# Patient Record
Sex: Female | Born: 2000 | Race: Black or African American | Hispanic: No | Marital: Single | Smoking: Never smoker
Health system: Southern US, Community
[De-identification: ages and names within clinical notes are randomized; demographics above are authoritative.]

## PROBLEM LIST (undated history)

## (undated) ENCOUNTER — Ambulatory Visit: Admission: EM | Payer: Managed Care, Other (non HMO)

## (undated) HISTORY — PX: OTHER SURGICAL HISTORY: SHX169

---

## 2020-11-06 ENCOUNTER — Encounter (HOSPITAL_COMMUNITY): Payer: Self-pay | Admitting: Emergency Medicine

## 2020-11-06 ENCOUNTER — Ambulatory Visit (HOSPITAL_COMMUNITY)
Admission: EM | Admit: 2020-11-06 | Discharge: 2020-11-06 | Disposition: A | Discharge status: Home / Self Care | Payer: Managed Care, Other (non HMO) | Attending: Emergency Medicine | Admitting: Emergency Medicine

## 2020-11-06 ENCOUNTER — Other Ambulatory Visit: Payer: Self-pay

## 2020-11-06 DIAGNOSIS — H1031 Unspecified acute conjunctivitis, right eye: Secondary | ICD-10-CM | POA: Diagnosis not present

## 2020-11-06 MED ORDER — ERYTHROMYCIN 5 MG/GM OP OINT: TOPICAL_OINTMENT | OPHTHALMIC | 0 refills | Status: DC

## 2020-11-06 NOTE — ED Triage Notes (Signed)
Pt states that a family member has pink eye and passed it to most of the family. Pt states that her sx stated Friday. Pt states that she also has a cough that started Wednesday. Pt states that she mostly coughs at night

## 2020-11-06 NOTE — Discharge Instructions (Addendum)
Use ointment as directed for 5 days.  Do not rub eyes.  Follow-up with your eye doctor in 2 to 3 days if no improvement, sooner if worse.

## 2020-11-07 ENCOUNTER — Encounter (HOSPITAL_COMMUNITY): Payer: Self-pay | Admitting: Emergency Medicine

## 2020-11-07 ENCOUNTER — Other Ambulatory Visit: Payer: Self-pay

## 2020-11-07 ENCOUNTER — Ambulatory Visit (HOSPITAL_COMMUNITY)
Admission: EM | Admit: 2020-11-07 | Discharge: 2020-11-07 | Disposition: A | Discharge status: Home / Self Care | Payer: Managed Care, Other (non HMO) | Attending: Emergency Medicine | Admitting: Emergency Medicine

## 2020-11-07 DIAGNOSIS — J020 Streptococcal pharyngitis: Secondary | ICD-10-CM

## 2020-11-07 DIAGNOSIS — J029 Acute pharyngitis, unspecified: Secondary | ICD-10-CM | POA: Diagnosis not present

## 2020-11-07 LAB — POCT RAPID STREP A, ED / UC: Streptococcus, Group A Screen (Direct): POSITIVE — AB

## 2020-11-07 MED ORDER — CEPHALEXIN 500 MG PO CAPS: 500.0000 mg | ORAL_CAPSULE | Freq: Two times a day (BID) | ORAL | 0 refills | Status: DC

## 2020-11-07 NOTE — ED Triage Notes (Signed)
Sore throat for a week.  Sore throat was not bad, until this morning.  Now throat feels to be on fire.  Patient has a cough  That worsens at night.  Denies fever.  Patient seen 11/06/2020 for conjunctivitis

## 2020-11-07 NOTE — Discharge Instructions (Signed)
You have strep throat.   Take the Keflex 1 pill twice a day for 10 days.   You can use Ibuprofen and/or Tylenol for fever reduction and pain relief.  You can use throat sprays, lozenges, warm salt water gargles for symptom relief.   Return or go to the Emergency Department if symptoms worsen or do not improve in the next few days. Marland Kitchen

## 2020-11-07 NOTE — ED Provider Notes (Signed)
[facility]   [id number] 11/07/20 Arrival Time: 1255  UL:AGTX THROAT  SUBJECTIVE: History from: patient.  Kimberly Cisneros is a 20 y.o. female who presents with abrupt onset of sore throat for 5 days.  Pain significantly worse this am.  Denies sick exposure to Covid, strep, flu or mono, or precipitating event. Symptoms are made worse with swallowing, but tolerating liquids and own secretions without difficulty.  Seen and treated for conjunctivitis yesterday with erythromycin eye ointment.   Denies fever, chills, fatigue, ear pain, sinus pain, rhinorrhea, nasal congestion, cough, SOB, wheezing, chest pain, nausea, rash, changes in bowel or bladder habits.     ROS: As per HPI.  All other pertinent ROS negative.     History reviewed. No pertinent past medical history. History reviewed. No pertinent surgical history. Allergies  Allergen Reactions  . Penicillins Swelling   No current facility-administered medications on file prior to encounter.   Current Outpatient Medications on File Prior to Encounter  Medication Sig Dispense Refill  . Pseudoeph-Doxylamine-DM-APAP (NYQUIL PO) Take by mouth.    . erythromycin ophthalmic ointment Place a 1/2 inch ribbon of ointment into the right  lower eyelid 4 times daily for 5 days 3.5 g 0   Social History   Socioeconomic History  . Marital status: Single    Spouse name: Not on file  . Number of children: Not on file  . Years of education: Not on file  . Highest education level: Not on file  Occupational History  . Not on file  Tobacco Use  . Smoking status: Never Smoker  . Smokeless tobacco: Never Used  Vaping Use  . Vaping Use: Never used  Substance and Sexual Activity  . Alcohol use: Yes    Comment: occass  . Drug use: Yes    Types: Marijuana  . Sexual activity: Not on file  Other Topics Concern  . Not on file  Social History Narrative  . Not on file   Social Determinants of Health   Financial Resource Strain: Not on  file  Food Insecurity: Not on file  Transportation Needs: Not on file  Physical Activity: Not on file  Stress: Not on file  Social Connections: Not on file  Intimate Partner Violence: Not on file   Family History  Problem Relation Age of Onset  . Breast cancer Mother   . Hypertension Father     OBJECTIVE:  Vitals:   11/07/20 1355  BP: 108/66  Pulse: 75  Resp: 18  Temp: 98.8 F (37.1 C)  TempSrc: Oral  SpO2: 100%     General appearance: alert; appears fatigued, but nontoxic, speaking in full sentences and managing own secretions HEENT: NCAT; Ears: EACs clear, TMs pearly gray with visible cone of light, without erythema; Eyes: PERRL, EOMI grossly; Nose: no obvious rhinorrhea; Throat: oropharynx clear, tonsils 1+ and mildly erythematous without white tonsillar exudates, uvula midline Neck: supple without LAD Lungs: CTA bilaterally without adventitious breath sounds; cough absent Heart: regular rate and rhythm.  Radial pulses 2+ symmetrical bilaterally Skin: warm and dry Psychological: alert and cooperative; normal mood and affect  LABS: Results for orders placed or performed during the hospital encounter of 11/07/20 (from the past 24 hour(s))  POCT Rapid Strep A     Status: Abnormal   Collection Time: 11/07/20  2:48 PM  Result Value Ref Range   Streptococcus, Group A Screen (Direct) POSITIVE (A) NEGATIVE     ASSESSMENT & PLAN:  1. Strep throat     Meds  ordered this encounter  Medications  . cephALEXin (KEFLEX) 500 MG capsule    Sig: Take 1 capsule (500 mg total) by mouth 2 (two) times daily for 10 days.    Dispense:  20 capsule    Refill:  0    Order Specific Question:   Supervising Provider    Answer:   Merrilee Jansky [id number]    Strep was positive.  Push fluids and get rest Prescribed keflex 500mg  BID for 10 days.  Take as directed and to completion.  Drink warm or cool liquids, use throat lozenges, or popsicles to help alleviate symptoms Take OTC  ibuprofen or tylenol as needed for pain Follow up with PCP if symptoms persist Return or go to ER if you have any new or worsening symptoms such as fever, chills, nausea, vomiting, worsening sore throat, cough, abdominal pain, chest pain, changes in bowel or bladder habits  Reviewed expectations re: course of current medical issues. Questions answered. Outlined signs and symptoms indicating need for more acute intervention. Patient verbalized understanding. After Visit Summary given.          , NP 11/07/20 1515

## 2020-11-16 ENCOUNTER — Telehealth (HOSPITAL_COMMUNITY): Payer: Self-pay | Admitting: Emergency Medicine

## 2020-11-16 DIAGNOSIS — J020 Streptococcal pharyngitis: Secondary | ICD-10-CM

## 2020-11-16 MED ORDER — CEPHALEXIN 500 MG PO CAPS: 500.0000 mg | ORAL_CAPSULE | Freq: Two times a day (BID) | ORAL | 0 refills | Status: AC

## 2020-11-16 NOTE — Telephone Encounter (Signed)
Informed by staff that patient has lost her Keflex prescription.  Will E prescribe a another prescription of Keflex 500 mg twice daily for 10 days for strep pharyngitis to pharmacy on record.

## 2021-05-15 ENCOUNTER — Other Ambulatory Visit: Payer: Self-pay

## 2021-05-15 ENCOUNTER — Ambulatory Visit (HOSPITAL_COMMUNITY)
Admission: EM | Admit: 2021-05-15 | Discharge: 2021-05-15 | Disposition: A | Discharge status: Home / Self Care | Payer: Managed Care, Other (non HMO) | Attending: Emergency Medicine | Admitting: Emergency Medicine

## 2021-05-15 ENCOUNTER — Encounter (HOSPITAL_COMMUNITY): Payer: Self-pay | Admitting: Emergency Medicine

## 2021-05-15 DIAGNOSIS — N898 Other specified noninflammatory disorders of vagina: Secondary | ICD-10-CM

## 2021-05-15 MED ORDER — FLUCONAZOLE 150 MG PO TABS: 150.0000 mg | ORAL_TABLET | Freq: Every day | ORAL | 0 refills | Status: AC

## 2021-05-15 NOTE — ED Provider Notes (Signed)
[facility]    CSN: [id number] Arrival date & time: 05/15/21  1118      History   Chief Complaint Chief Complaint  Patient presents with   Vaginal Discharge    HPI Kimberly Cisneros is a 20 y.o. female.   Patient presents with thick Kimberly Cisneros discharge for 4 days with associated odor.  Sexually active, 1 partner, condom use.  Endorses that this is occurred occurred before when using condoms.  Denies itching, irritation, dysuria, hematuria, abdominal pain or pressure, flank pain, fever, chills.  History reviewed. No pertinent past medical history.  Patient Active Problem List   Diagnosis Date Noted   Acute bacterial conjunctivitis of right eye 11/06/2020    History reviewed. No pertinent surgical history.  OB History   No obstetric history on file.      Home Medications    Prior to Admission medications   Medication Sig Start Date End Date Taking? Authorizing Provider  fluconazole (DIFLUCAN) 150 MG tablet Take 1 tablet (150 mg total) by mouth daily for 2 doses. 05/15/21 05/17/21 Yes Sire Poet, Elita Boone, NP  erythromycin ophthalmic ointment Place a 1/2 inch ribbon of ointment into the right  lower eyelid 4 times daily for 5 days 11/06/20   Defelice, Para March, NP  Pseudoeph-Doxylamine-DM-APAP (NYQUIL PO) Take by mouth.    [provider]    Family History Family History  Problem Relation Age of Onset   Breast cancer Mother    Hypertension Father     Social History Social History   Tobacco Use   Smoking status: Never   Smokeless tobacco: Never  Vaping Use   Vaping Use: Never used  Substance Use Topics   Alcohol use: Yes    Comment: occass   Drug use: Yes    Types: Marijuana     Allergies   Penicillins   Review of Systems Review of Systems Defer to HPI   Physical Exam Triage Vital Signs ED Triage Vitals  Enc Vitals Group     BP 05/15/21 1259 111/68     Pulse Rate 05/15/21 1259 76     Resp 05/15/21 1259 16     Temp 05/15/21 1259 98.7 F  (37.1 C)     Temp Source 05/15/21 1259 Oral     SpO2 05/15/21 1259 100 %     Weight --      Height --      Head Circumference --      Peak Flow --      Pain Score 05/15/21 1300 0     Pain Loc --      Pain Edu? --      Excl. in GC? --    No data found.  Updated Vital Signs BP 111/68 (BP Location: Left Arm)   Pulse 76   Temp 98.7 F (37.1 C) (Oral)   Resp 16   LMP 05/08/2021   SpO2 100%   Visual Acuity Right Eye Distance:   Left Eye Distance:   Bilateral Distance:    Right Eye Near:   Left Eye Near:    Bilateral Near:     Physical Exam Constitutional:      Appearance: Normal appearance. She is normal weight.  HENT:     Head: Normocephalic.  Eyes:     Extraocular Movements: Extraocular movements intact.  Pulmonary:     Effort: Pulmonary effort is normal.  Genitourinary:    Comments: Deferred, vaginal swab self collect  Neurological:     Mental Status: She  is alert and oriented to person, place, and time. Mental status is at baseline.  Psychiatric:        Mood and Affect: Mood normal.        Behavior: Behavior normal.     UC Treatments / Results  Labs (all labs ordered are listed, but only abnormal results are displayed) Labs Reviewed  CERVICOVAGINAL ANCILLARY ONLY    EKG   Radiology No results found.  Procedures Procedures (including critical care time)  Medications Ordered in UC Medications - No data to display  Initial Impression / Assessment and Plan / UC Course  I have reviewed the triage vital signs and the nursing notes.  Pertinent labs & imaging results that were available during my care of the patient were reviewed by me and considered in my medical decision making (see chart for details).  Vaginal discharge  1.  Diflucan 1 50 mg once, Diflucan 150 mg in 72 hours as needed 2.  Vaginal swab pending, will treat per protocol, advised abstinence until labs results, treatment is complete, all symptoms have resolved 3.  Advised patient  switch to different condom to see if this helps symptoms   Final Clinical Impressions(s) / UC Diagnoses   Final diagnoses:  Vaginal discharge     Discharge Instructions      Today you are being treated prophylactically for yeast.   Take diflucan 150 mg once, if symptoms still present in 3 days then you may take second pill   Yeast infections which are caused by a naturally occurring fungus called candida. Vaginosis is an inflammation of the vagina that can result in discharge, itching and pain. The cause is usually a change in the normal balance of vaginal bacteria or an infection. Vaginosis can also result from reduced estrogen levels after menopause.  Labs pending 2-3 days, you will be contacted if positive for any sti and treatment will be sent to the pharmacy, you will have to return to the clinic if positive for gonorrhea to receive treatment   Please refrain from having sex until labs results, if positive please refrain from having sex until treatment complete and symptoms resolve   If positive for HIV, Syphilis, Chlamydia  gonorrhea or trichomoniasis please notify partner or partners so they may tested as well  Moving forward, it is recommended you use some form of protection against the transmission of sti infections  such as condoms or dental dams with each sexual encounter    In addition:   Avoid baths, hot tubs and whirlpool spas.  Don't use scented or harsh soaps, such as those with deodorant or antibacterial action. Avoid irritants. These include scented tampons and pads. Wipe from front to back after using the toilet.  Don't douche. Your vagina doesn't require cleansing other than normal bathing.  Use a  condom. Wear cotton underwear, this fabric helps absorb moisture     ED Prescriptions     Medication Sig Dispense Auth. Provider   fluconazole (DIFLUCAN) 150 MG tablet Take 1 tablet (150 mg total) by mouth daily for 2 doses. 2 tablet Valinda Hoar, NP       PDMP not reviewed this encounter.   Valinda Hoar, NP 05/15/21 [phone]

## 2021-05-15 NOTE — Discharge Instructions (Addendum)
Today you are being treated prophylactically for yeast.   Take diflucan 150 mg once, if symptoms still present in 3 days then you may take second pill   Yeast infections which are caused by a naturally occurring fungus called candida. Vaginosis is an inflammation of the vagina that can result in discharge, itching and pain. The cause is usually a change in the normal balance of vaginal bacteria or an infection. Vaginosis can also result from reduced estrogen levels after menopause.  Labs pending 2-3 days, you will be contacted if positive for any sti and treatment will be sent to the pharmacy, you will have to return to the clinic if positive for gonorrhea to receive treatment   Please refrain from having sex until labs results, if positive please refrain from having sex until treatment complete and symptoms resolve   If positive for HIV, Syphilis, Chlamydia  gonorrhea or trichomoniasis please notify partner or partners so they may tested as well  Moving forward, it is recommended you use some form of protection against the transmission of sti infections  such as condoms or dental dams with each sexual encounter    In addition:   Avoid baths, hot tubs and whirlpool spas.  Don't use scented or harsh soaps, such as those with deodorant or antibacterial action. Avoid irritants. These include scented tampons and pads. Wipe from front to back after using the toilet.  Don't douche. Your vagina doesn't require cleansing other than normal bathing.  Use a  condom. Wear cotton underwear, this fabric helps absorb moisture   

## 2021-05-15 NOTE — ED Triage Notes (Signed)
Pt reports started back with a sexual partner and using condoms. Reports when uses condoms will get yeast infection. Since Friday reports discharge "looks and smell like yeast".

## 2021-05-16 LAB — CERVICOVAGINAL ANCILLARY ONLY
Bacterial Vaginitis (gardnerella): POSITIVE — AB
Candida Glabrata: NEGATIVE
Candida Vaginitis: NEGATIVE
Chlamydia: NEGATIVE
Comment: NEGATIVE
Comment: NEGATIVE
Comment: NEGATIVE
Comment: NEGATIVE
Comment: NEGATIVE
Comment: NORMAL
Neisseria Gonorrhea: NEGATIVE
Trichomonas: NEGATIVE

## 2021-05-18 ENCOUNTER — Telehealth (HOSPITAL_COMMUNITY): Payer: Self-pay | Admitting: Family Medicine

## 2021-05-18 MED ORDER — METRONIDAZOLE 500 MG PO TABS: 500.0000 mg | ORAL_TABLET | Freq: Two times a day (BID) | ORAL | 0 refills | Status: DC

## 2021-05-18 NOTE — Telephone Encounter (Signed)
Flagyl sent for positive BV

## 2021-06-13 ENCOUNTER — Other Ambulatory Visit: Payer: Self-pay

## 2021-06-13 ENCOUNTER — Encounter (HOSPITAL_COMMUNITY): Payer: Self-pay

## 2021-06-13 ENCOUNTER — Ambulatory Visit (HOSPITAL_COMMUNITY)
Admission: EM | Admit: 2021-06-13 | Discharge: 2021-06-13 | Disposition: A | Discharge status: Home / Self Care | Payer: Managed Care, Other (non HMO) | Attending: Emergency Medicine | Admitting: Emergency Medicine

## 2021-06-13 DIAGNOSIS — J039 Acute tonsillitis, unspecified: Secondary | ICD-10-CM

## 2021-06-13 LAB — POCT RAPID STREP A, ED / UC: Streptococcus, Group A Screen (Direct): NEGATIVE

## 2021-06-13 MED ORDER — CEPHALEXIN 500 MG PO CAPS: 500.0000 mg | ORAL_CAPSULE | Freq: Two times a day (BID) | ORAL | 0 refills | Status: AC

## 2021-06-13 NOTE — ED Provider Notes (Signed)
[facility]    CSN: [id number] Arrival date & time: 06/13/21  1142      History   Chief Complaint Chief Complaint  Patient presents with   Sore Throat    HPI Kimberly Cisneros is a 20 y.o. female.   Patient here for evaluation of sore throat that has been ongoing since yesterday.  Reports noticing some white patches on her tonsils last night.  Reports history of strep throat with similar symptoms in the past.  Denies any recent sick contacts.  Denies any fevers.  Denies any nasal congestion or cough.  Reports throat is painful to swallow but managing secretions.  Not taken any OTC medications or treatments.  Denies any trauma, injury, or other precipitating event.  Denies any chest pain, shortness of breath, N/V/D, numbness, tingling, weakness, abdominal pain, or headaches.    The history is provided by the patient.  Sore Throat   History reviewed. No pertinent past medical history.  Patient Active Problem List   Diagnosis Date Noted   Acute bacterial conjunctivitis of right eye 11/06/2020    History reviewed. No pertinent surgical history.  OB History   No obstetric history on file.      Home Medications    Prior to Admission medications   Medication Sig Start Date End Date Taking? Authorizing Provider  cephALEXin (KEFLEX) 500 MG capsule Take 1 capsule (500 mg total) by mouth 2 (two) times daily for 10 days. 06/13/21 06/23/21 Yes Ivette Loyal, NP  erythromycin ophthalmic ointment Place a 1/2 inch ribbon of ointment into the right  lower eyelid 4 times daily for 5 days Patient not taking: Reported on 06/13/2021 11/06/20   Defelice, Para March, NP  metroNIDAZOLE (FLAGYL) 500 MG tablet Take 1 tablet (500 mg total) by mouth 2 (two) times daily. Patient not taking: Reported on 06/13/2021 05/18/21   Particia Nearing, PA-C  Pseudoeph-Doxylamine-DM-APAP (NYQUIL PO) Take by mouth. Patient not taking: Reported on 06/13/2021    [provider]    Family  History Family History  Problem Relation Age of Onset   Breast cancer Mother    Hypertension Father     Social History Social History   Tobacco Use   Smoking status: Never   Smokeless tobacco: Never  Vaping Use   Vaping Use: Never used  Substance Use Topics   Alcohol use: Yes    Comment: occass   Drug use: Yes    Types: Marijuana     Allergies   Penicillins   Review of Systems Review of Systems  HENT:  Positive for sore throat and trouble swallowing.   All other systems reviewed and are negative.   Physical Exam Triage Vital Signs ED Triage Vitals  Enc Vitals Group     BP 06/13/21 1309 101/66     Pulse Rate 06/13/21 1309 90     Resp 06/13/21 1309 14     Temp 06/13/21 1309 98.8 F (37.1 C)     Temp Source 06/13/21 1309 Oral     SpO2 06/13/21 1309 96 %     Weight --      Height --      Head Circumference --      Peak Flow --      Pain Score 06/13/21 1310 4     Pain Loc --      Pain Edu? --      Excl. in GC? --    No data found.  Updated Vital Signs BP 101/66 (BP  Location: Right Arm)   Pulse 90   Temp 98.8 F (37.1 C) (Oral)   Resp 14   SpO2 96%   Visual Acuity Right Eye Distance:   Left Eye Distance:   Bilateral Distance:    Right Eye Near:   Left Eye Near:    Bilateral Near:     Physical Exam Vitals and nursing note reviewed.  Constitutional:      General: She is not in acute distress.    Appearance: Normal appearance. She is not ill-appearing, toxic-appearing or diaphoretic.  HENT:     Head: Normocephalic and atraumatic.     Nose: No congestion or rhinorrhea.     Mouth/Throat:     Pharynx: Uvula midline. Pharyngeal swelling, oropharyngeal exudate and posterior oropharyngeal erythema present.     Tonsils: Tonsillar exudate present. No tonsillar abscesses. 2+ on the right. 2+ on the left.  Eyes:     Conjunctiva/sclera: Conjunctivae normal.  Cardiovascular:     Rate and Rhythm: Normal rate and regular rhythm.     Pulses: Normal  pulses.     Heart sounds: Normal heart sounds.  Pulmonary:     Effort: Pulmonary effort is normal.  Abdominal:     General: Abdomen is flat.  Musculoskeletal:        General: Normal range of motion.     Cervical back: Normal range of motion.  Skin:    General: Skin is warm and dry.  Neurological:     General: No focal deficit present.     Mental Status: She is alert and oriented to person, place, and time.  Psychiatric:        Mood and Affect: Mood normal.     UC Treatments / Results  Labs (all labs ordered are listed, but only abnormal results are displayed) Labs Reviewed  CULTURE, GROUP A STREP [facility])  POCT RAPID STREP A, ED / UC    EKG   Radiology No results found.  Procedures Procedures (including critical care time)  Medications Ordered in UC Medications - No data to display  Initial Impression / Assessment and Plan / UC Course  I have reviewed the triage vital signs and the nursing notes.  Pertinent labs & imaging results that were available during my care of the patient were reviewed by me and considered in my medical decision making (see chart for details).    Assessment negative for red flags or concerns.  Acute tonsillitis.  Rapid strep negative, throat culture pending.  Keflex twice a day for the next 10 days.  Tylenol and or ibuprofen as needed.  Discussed conservative symptom management as described in discharge instructions.  Follow-up as needed for reevaluation. Final Clinical Impressions(s) / UC Diagnoses   Final diagnoses:  Tonsillitis     Discharge Instructions      Take the Keflex twice a day for the next 10 days.   You can take Tylenol and/or Ibuprofen as needed for fever reduction and pain relief.   For sore throat: try warm salt water gargles, cepacol lozenges, throat spray, warm tea or water with lemon/honey, popsicles or ice   It is important to stay hydrated: drink plenty of fluids (water, gatorade/powerade/pedialyte, juices, or  teas).   Return or go to the Emergency Department if symptoms worsen or do not improve in the next few days.      ED Prescriptions     Medication Sig Dispense Auth. Provider   cephALEXin (KEFLEX) 500 MG capsule Take 1 capsule (500 mg total)  by mouth 2 (two) times daily for 10 days. 20 capsule Ivette Loyal, NP      PDMP not reviewed this encounter.   Ivette Loyal, NP 06/13/21 1424

## 2021-06-13 NOTE — ED Triage Notes (Signed)
Pt presents with sore throat with Ellizabeth Dacruz patches that began last night

## 2021-06-13 NOTE — Discharge Instructions (Addendum)
Take the Keflex twice a day for the next 10 days.   You can take Tylenol and/or Ibuprofen as needed for fever reduction and pain relief.   For sore throat: try warm salt water gargles, cepacol lozenges, throat spray, warm tea or water with lemon/honey, popsicles or ice   It is important to stay hydrated: drink plenty of fluids (water, gatorade/powerade/pedialyte, juices, or teas).   Return or go to the Emergency Department if symptoms worsen or do not improve in the next few days.

## 2021-06-13 NOTE — ED Notes (Signed)
Pt called from lobby x2

## 2021-06-16 LAB — CULTURE, GROUP A STREP (THRC)

## 2021-06-23 ENCOUNTER — Emergency Department (HOSPITAL_COMMUNITY): Payer: Managed Care, Other (non HMO)

## 2021-06-23 ENCOUNTER — Encounter: Payer: Self-pay | Admitting: General Practice

## 2021-06-23 ENCOUNTER — Encounter (HOSPITAL_COMMUNITY): Payer: Self-pay | Admitting: *Deleted

## 2021-06-23 ENCOUNTER — Emergency Department (HOSPITAL_COMMUNITY)
Admission: EM | Admit: 2021-06-23 | Discharge: 2021-06-23 | Disposition: A | Discharge status: Home / Self Care | Payer: Managed Care, Other (non HMO) | Attending: Emergency Medicine | Admitting: Emergency Medicine

## 2021-06-23 ENCOUNTER — Other Ambulatory Visit: Payer: Self-pay

## 2021-06-23 ENCOUNTER — Ambulatory Visit
Admission: EM | Admit: 2021-06-23 | Discharge: 2021-06-23 | Disposition: A | Discharge status: Home / Self Care | Payer: Managed Care, Other (non HMO)

## 2021-06-23 DIAGNOSIS — R079 Chest pain, unspecified: Secondary | ICD-10-CM | POA: Diagnosis present

## 2021-06-23 DIAGNOSIS — K209 Esophagitis, unspecified without bleeding: Secondary | ICD-10-CM | POA: Diagnosis not present

## 2021-06-23 DIAGNOSIS — T50905A Adverse effect of unspecified drugs, medicaments and biological substances, initial encounter: Secondary | ICD-10-CM

## 2021-06-23 DIAGNOSIS — K208 Other esophagitis without bleeding: Secondary | ICD-10-CM

## 2021-06-23 LAB — COMPREHENSIVE METABOLIC PANEL
ALT: 13 U/L (ref 0–44)
AST: 16 U/L (ref 15–41)
Albumin: 4.1 g/dL (ref 3.5–5.0)
Alkaline Phosphatase: 42 U/L (ref 38–126)
Anion gap: 7 (ref 5–15)
BUN: 9 mg/dL (ref 6–20)
CO2: 25 mmol/L (ref 22–32)
Calcium: 9.2 mg/dL (ref 8.9–10.3)
Chloride: 101 mmol/L (ref 98–111)
Creatinine, Ser: 0.75 mg/dL (ref 0.44–1.00)
GFR, Estimated: >60 mL/min (ref >60)
Glucose, Bld: 86 mg/dL (ref 70–99)
Potassium: 3.8 mmol/L (ref 3.5–5.1)
Sodium: 133 mmol/L — ABNORMAL LOW (ref 135–145)
Total Bilirubin: 0.6 mg/dL (ref 0.3–1.2)
Total Protein: 7.8 g/dL (ref 6.5–8.1)

## 2021-06-23 LAB — CBC WITH DIFFERENTIAL/PLATELET
Abs Immature Granulocytes: 0.02 10*3/uL (ref 0.00–0.07)
Basophils Absolute: 0 10*3/uL (ref 0.0–0.1)
Basophils Relative: 0 %
Eosinophils Absolute: 0 10*3/uL (ref 0.0–0.5)
Eosinophils Relative: 1 %
HCT: 40.4 % (ref 36.0–46.0)
Hemoglobin: 13.2 g/dL (ref 12.0–15.0)
Immature Granulocytes: 0 %
Lymphocytes Relative: 18 %
Lymphs Abs: 1.2 10*3/uL (ref 0.7–4.0)
MCH: 31.4 pg (ref 26.0–34.0)
MCHC: 32.7 g/dL (ref 30.0–36.0)
MCV: 96 fL (ref 80.0–100.0)
Monocytes Absolute: 0.6 10*3/uL (ref 0.1–1.0)
Monocytes Relative: 10 %
Neutro Abs: 4.6 10*3/uL (ref 1.7–7.7)
Neutrophils Relative %: 71 %
Platelets: 264 10*3/uL (ref 150–400)
RBC: 4.21 MIL/uL (ref 3.87–5.11)
RDW: 12.4 % (ref 11.5–15.5)
WBC: 6.5 10*3/uL (ref 4.0–10.5)
nRBC: 0 % (ref 0.0–0.2)

## 2021-06-23 LAB — I-STAT BETA HCG BLOOD, ED (MC, WL, AP ONLY): I-stat hCG, quantitative: <5 m[IU]/mL (ref <5)

## 2021-06-23 LAB — D-DIMER, QUANTITATIVE: D-Dimer, Quant: 0.4 ug/mL-FEU (ref 0.00–0.50)

## 2021-06-23 MED ORDER — SUCRALFATE 1 GM/10ML PO SUSP: 1.0000 g | Freq: Three times a day (TID) | ORAL | 0 refills | Status: AC

## 2021-06-23 MED ORDER — ALUM & MAG HYDROXIDE-SIMETH 200-200-20 MG/5ML PO SUSP: 30.0000 mL | Freq: Once | ORAL | Status: DC

## 2021-06-23 MED ORDER — OMEPRAZOLE 20 MG PO CPDR: 20.0000 mg | DELAYED_RELEASE_CAPSULE | Freq: Every day | ORAL | 0 refills | Status: AC

## 2021-06-23 MED ORDER — LIDOCAINE VISCOUS HCL 2 % MT SOLN: 15.0000 mL | Freq: Once | OROMUCOSAL | Status: DC

## 2021-06-23 NOTE — ED Triage Notes (Signed)
Had surgery 10/11/200, labiaplasty, now has chest pain, hard to swallow because she belives a medicine got stuck in her throaton  06/21/2011. Been eating and drinking but it still hurts. Possibly wants an x ray. Pain 7/10 when eating. 4/10 pain when drinking.

## 2021-06-23 NOTE — ED Provider Notes (Signed)
Emergency Medicine Provider Triage Evaluation Note  Kimberly Cisneros , a 20 y.o. female  was evaluated in triage.  Pt complains of chest pain.  Feels like there something stuck in her throat, worse when she swallows or when she has inspiration.  Patient reports she had labioplasty done Tuesday, not on any oral birth control.  No history of Cisneros clots, patient does not smoke cigarettes.  Review of Systems  Positive: Chest pain Negative: Shortness of breath  Physical Exam  BP 106/78 (BP Location: Left Arm)   Pulse 73   Temp 99.3 F (37.4 C) (Oral)   Resp 14   Ht 5\' 6"  (1.676 m)   Wt 59 kg   LMP 05/29/2021 (Approximate)   SpO2 100%   BMI 20.98 kg/m  Gen:   Awake, no distress   Resp:  Normal effort  MSK:   Moves extremities without difficulty  Other:    Medical Decision Making  Medically screening exam initiated at 3:35 PM.  Appropriate orders placed.  Kimberly Cisneros was informed that the remainder of the evaluation will be completed by another provider, this initial triage assessment does not replace that evaluation, and the importance of remaining in the ED until their evaluation is complete.  Patient having pleuritic chest pain and plus recent surgery, will get dimer.  We will also check additional chest pain labs, risk factors for ACS are low but will get initial troponin.   Kimberly Blood, PA-C 06/23/21 1537    06/25/21, MD 06/23/21 1649

## 2021-06-23 NOTE — Discharge Instructions (Addendum)
Please read and follow all provided instructions.  Your diagnoses today include:  1. Pill esophagitis     Tests performed today include: Complete blood cell count: was normal EKG: showed some t-wave changes Chest x-ray: no problems seen Pregnancy test (urine or blood, in women only): was negative Screening test for blood clot: was negative Vital signs. See below for your results today.   Medications prescribed:  Omeprazole (Prilosec) - stomach acid reducer  This medication can be found over-the-counter  Carafate - for stomach upset and to protect your stomach  Take any prescribed medications only as directed.  Home care instructions:  Follow any educational materials contained in this packet.  BE VERY CAREFUL not to take multiple medicines containing Tylenol (also called acetaminophen). Doing so can lead to an overdose which can damage your liver and cause liver failure and possibly death.   Follow-up instructions: Please follow-up with your primary care provider or with the gastroenterology referral in 1 week if not improving.  Return instructions:  Please return to the Emergency Department if you experience worsening symptoms.  Return with worsening pain in the chest, difficulty breathing or swallowing, fever, persistent vomiting. Please return if you have any other emergent concerns.  Additional Information:  Your vital signs today were: BP 106/78 (BP Location: Left Arm)   Pulse (!) 52   Temp 99.3 F (37.4 C) (Oral)   Resp 14   Ht 5\' 6"  (1.676 m)   Wt 59 kg   LMP 05/29/2021 (Approximate)   SpO2 100%   BMI 20.98 kg/m  If your blood pressure (BP) was elevated above 135/85 this visit, please have this repeated by your doctor within one month. --------------

## 2021-06-23 NOTE — ED Provider Notes (Signed)
[facility] EMERGENCY DEPARTMENT Provider Note   CSN: [id number] Arrival date & time: 06/23/21  1437     History Chief Complaint  Patient presents with   Chest Pain    Kimberly Cisneros is a 20 y.o. female.  Patient presents the emergency department for evaluation of chest pain and pain with swallowing.  Patient had a surgery 4 days ago.  She was prescribed a pain medication and antibiotic subsequently.  She states that 3 days ago she swallowed a pill and felt like it got stuck in her throat.  Since that time she has had pain when she swallows.  No trouble breathing.  She is able to swallow liquids and solids.  No fevers.  No blood in the stool.  Patient denies other risk factors for pulmonary embolism including: unilateral leg swelling, history of DVT/PE/other blood clots, use of exogenous hormones, recent immobilizations, recent travel (>4hr segment), malignancy, hemoptysis.  No treatments prior to arrival.  The onset of this condition was acute. The course is constant. Alleviating factors: none.        History reviewed. No pertinent past medical history.  Patient Active Problem List   Diagnosis Date Noted   Acute bacterial conjunctivitis of right eye 11/06/2020    Past Surgical History:  Procedure Laterality Date   labiaplasty       OB History   No obstetric history on file.     Family History  Problem Relation Age of Onset   Breast cancer Mother    Hypertension Father     Social History   Tobacco Use   Smoking status: Never   Smokeless tobacco: Never  Vaping Use   Vaping Use: Never used  Substance Use Topics   Alcohol use: Yes    Comment: occass   Drug use: Not Currently    Home Medications Prior to Admission medications   Medication Sig Start Date End Date Taking? Authorizing Provider  omeprazole (PRILOSEC) 20 MG capsule Take 1 capsule (20 mg total) by mouth daily. 06/23/21  Yes Renne Crigler, PA-C  sucralfate (CARAFATE) 1 GM/10ML  suspension Take 10 mLs (1 g total) by mouth 4 (four) times daily -  with meals and at bedtime. 06/23/21  Yes Renne Crigler, PA-C  cephALEXin (KEFLEX) 500 MG capsule Take 1 capsule (500 mg total) by mouth 2 (two) times daily for 10 days. 06/13/21 06/23/21  Ivette Loyal, NP    Allergies    Penicillins  Review of Systems   Review of Systems  Constitutional:  Negative for diaphoresis and fever.  HENT:  Positive for trouble swallowing.   Eyes:  Negative for redness.  Respiratory:  Negative for cough and shortness of breath.   Cardiovascular:  Positive for chest pain. Negative for palpitations and leg swelling.  Gastrointestinal:  Negative for abdominal pain, nausea and vomiting.  Genitourinary:  Negative for dysuria.  Musculoskeletal:  Negative for back pain and neck pain.  Skin:  Negative for rash.  Neurological:  Negative for syncope and light-headedness.  Psychiatric/Behavioral:  The patient is not nervous/anxious.    Physical Exam Updated Vital Signs BP 106/78 (BP Location: Left Arm)   Pulse (!) 52   Temp 99.3 F (37.4 C) (Oral)   Resp 14   Ht 5\' 6"  (1.676 m)   Wt 59 kg   LMP 05/29/2021 (Approximate)   SpO2 100%   BMI 20.98 kg/m   Physical Exam Vitals and nursing note reviewed.  Constitutional:      Appearance: She  is well-developed. She is not diaphoretic.  HENT:     Head: Normocephalic and atraumatic.     Mouth/Throat:     Mouth: Mucous membranes are not dry.  Eyes:     Conjunctiva/sclera: Conjunctivae normal.  Neck:     Vascular: Normal carotid pulses. No JVD.     Trachea: Trachea normal. No tracheal deviation.  Cardiovascular:     Rate and Rhythm: Normal rate and regular rhythm.     Pulses: No decreased pulses.          Radial pulses are 2+ on the right side and 2+ on the left side.     Heart sounds: Normal heart sounds, S1 normal and S2 normal. No murmur heard. Pulmonary:     Effort: Pulmonary effort is normal. No respiratory distress.     Breath sounds:  No wheezing.  Chest:     Chest wall: No tenderness.  Abdominal:     General: Bowel sounds are normal.     Palpations: Abdomen is soft.     Tenderness: There is no abdominal tenderness. There is no guarding or rebound.  Musculoskeletal:        General: Normal range of motion.     Cervical back: Normal range of motion and neck supple. No muscular tenderness.  Skin:    General: Skin is warm and dry.     Coloration: Skin is not pale.  Neurological:     Mental Status: She is alert.    ED Results / Procedures / Treatments   Labs (all labs ordered are listed, but only abnormal results are displayed) Labs Reviewed  CBC WITH DIFFERENTIAL/PLATELET  D-DIMER, QUANTITATIVE  COMPREHENSIVE METABOLIC PANEL  I-STAT BETA HCG BLOOD, ED ([facility], [facility], [facility] ONLY)    ED ECG REPORT   Date: 06/23/2021  Rate: 77  Rhythm: normal sinus rhythm  QRS Axis: normal  Intervals: normal  ST/T Wave abnormalities: nonspecific T wave changes  Conduction Disutrbances:none  Narrative Interpretation:   Old EKG Reviewed: none available  I have personally reviewed the EKG tracing and agree with the computerized printout as noted.   Radiology DG Chest 2 View  Result Date: 06/23/2021 CLINICAL DATA:  Chest pain that began after patient swallowed a pill. EXAM: CHEST - 2 VIEW COMPARISON:  None. FINDINGS: The cardiomediastinal contours are within normal limits. The lungs are clear. No pneumothorax or pleural effusion. No acute finding in the visualized skeleton. IMPRESSION: No acute cardiopulmonary process. Electronically Signed   By: Emmaline Kluver M.D.   On: 06/23/2021 15:54    Procedures Procedures   Medications Ordered in ED Medications  alum & mag hydroxide-simeth (MAALOX/MYLANTA) 200-200-20 MG/5ML suspension 30 mL (has no administration in time range)    And  lidocaine (XYLOCAINE) 2 % viscous mouth solution 15 mL (has no administration in time range)    ED Course  I have reviewed the triage vital signs  and the nursing notes.  Pertinent labs & imaging results that were available during my care of the patient were reviewed by me and considered in my medical decision making (see chart for details).  Patient seen and examined.  Work-up reviewed.  CBC is negative.  D-dimer negative.  Patient is not pregnant.  Chest x-ray without any acute findings.  EKG with T wave abnormality however symptoms are not consistent with ischemia and patient without other concerning features on EKG.  Discussed that her symptoms are most consistent with pill esophagitis.  Will give GI cocktail here.  Will give PPI  and Carafate for home.  She is instructed to use these medications for 1 week.  Encouraged follow-up with PCP or GI if not improved at this time.  She is from [employer].  Referral given.  Vital signs reviewed and are as follows: BP 106/78 (BP Location: Left Arm)   Pulse (!) 52   Temp 99.3 F (37.4 C) (Oral)   Resp 14   Ht 5\' 6"  (1.676 m)   Wt 59 kg   LMP 05/29/2021 (Approximate)   SpO2 100%   BMI 20.98 kg/m      MDM Rules/Calculators/A&P                           Patient with chest pain, trouble swallowing after a pill got stuck in her throat.  No esophageal obstruction symptoms here.  She has continued pain, mainly with swallowing.  Doubt ischemia, PE.  We will treat for esophagitis as above.  Patient looks well very comfortable on exam.    Final Clinical Impression(s) / ED Diagnoses Final diagnoses:  Pill esophagitis    Rx / DC Orders ED Discharge Orders          Ordered    omeprazole (PRILOSEC) 20 MG capsule  Daily        06/23/21 1819    sucralfate (CARAFATE) 1 GM/10ML suspension  3 times daily with meals & bedtime        06/23/21 1819             06/25/21, PA-C 06/23/21 1825    06/25/21, MD 06/25/21 1549

## 2021-06-23 NOTE — ED Triage Notes (Signed)
PT with labiaplasty on 10/11 with chest pain that began after she swallowed pill that "stuck" in her throat on 10/12.  Denies sob. Chest pain increases with eating.

## 2022-08-26 IMAGING — CR DG CHEST 2V
2 series · 2 of 2 positions shown · non-contrast
Comparison: None.

CLINICAL DATA: Chest pain that began after patient swallowed a
pill.

EXAM:
CHEST - 2 VIEW

[chest pa]
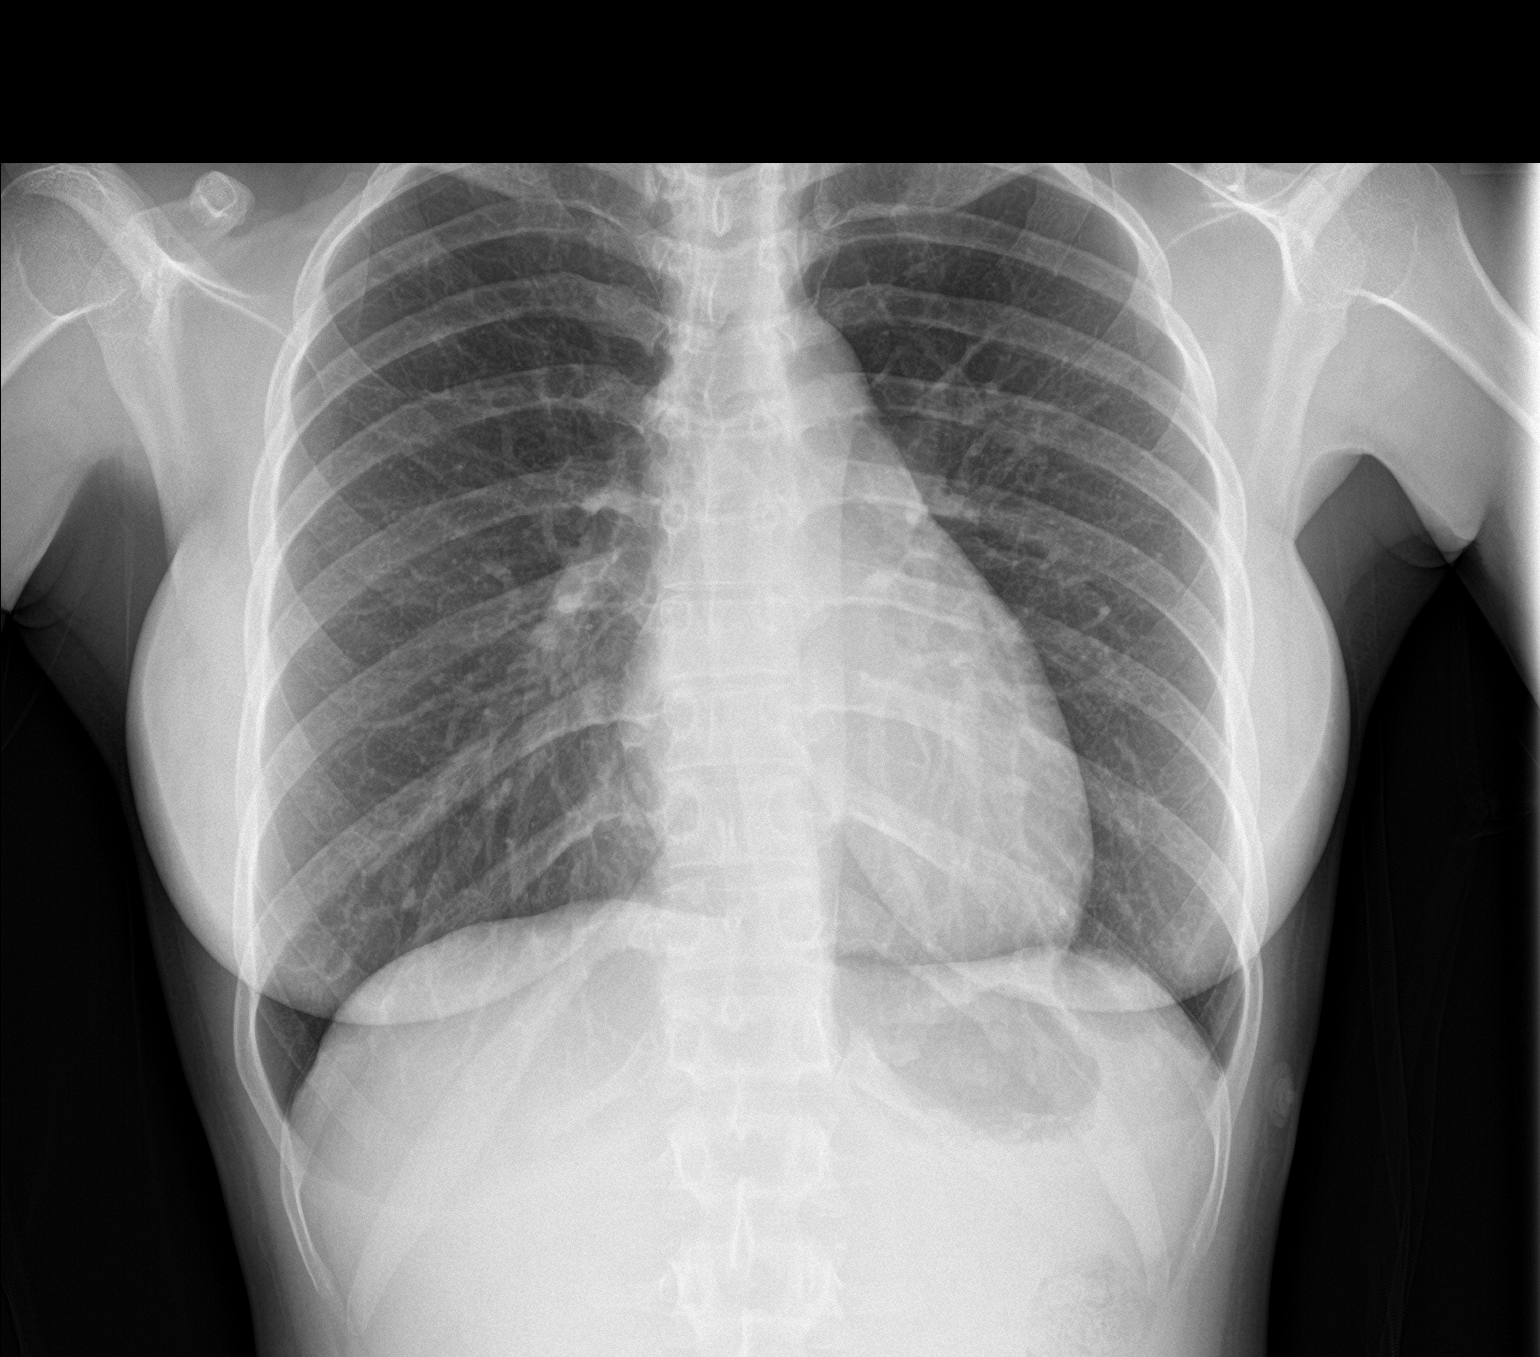

[chest lat]
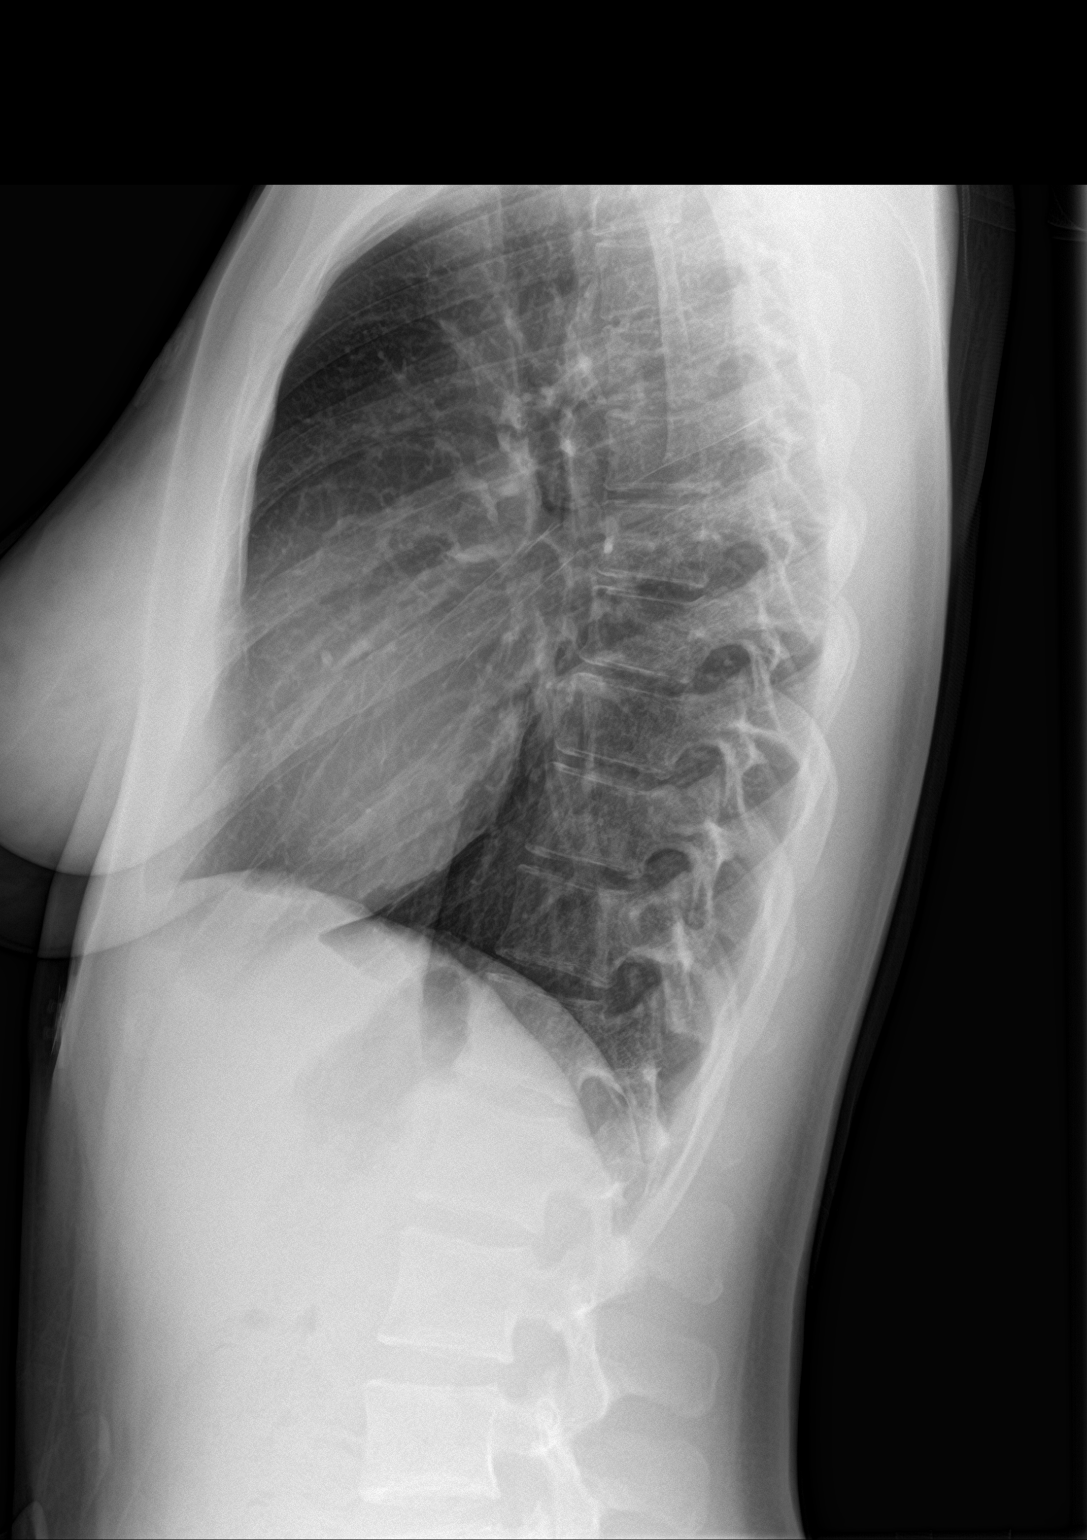

[2 of 2 positions shown; findings below may reference images not displayed]

FINDINGS: The cardiomediastinal contours are within normal limits. The lungs
are clear. No pneumothorax or pleural effusion. No acute finding in
the visualized skeleton.
IMPRESSION: No acute cardiopulmonary process.
